# Patient Record
Sex: Male | Born: 1995
Health system: Southern US, Community
[De-identification: ages and names within clinical notes are randomized; demographics above are authoritative.]

## PROBLEM LIST (undated history)

## (undated) DIAGNOSIS — F32A Depression, unspecified: Secondary | ICD-10-CM

## (undated) DIAGNOSIS — R569 Unspecified convulsions: Secondary | ICD-10-CM

## (undated) HISTORY — DX: Depression, unspecified: F32.A

## (undated) HISTORY — DX: Unspecified convulsions: R56.9

---
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
## 2013-02-04 ENCOUNTER — Other Ambulatory Visit: Payer: Self-pay | Admitting: Family

## 2013-02-04 DIAGNOSIS — G40309 Generalized idiopathic epilepsy and epileptic syndromes, not intractable, without status epilepticus: Secondary | ICD-10-CM

## 2013-02-04 DIAGNOSIS — Z79899 Other long term (current) drug therapy: Secondary | ICD-10-CM

## 2013-02-05 LAB — CBC WITH DIFFERENTIAL/PLATELET
Eosinophils Absolute: 0.2 10*3/uL (ref 0.0–0.7)
Eosinophils Relative: 4 % (ref 0–5)
HCT: 44.2 % (ref 39.0–52.0)
Hemoglobin: 16.2 g/dL (ref 13.0–17.0)
Lymphocytes Relative: 31 % (ref 12–46)
Lymphs Abs: 1.8 10*3/uL (ref 0.7–4.0)
MCH: 30.5 pg (ref 26.0–34.0)
MCV: 83.2 fL (ref 78.0–100.0)
Monocytes Absolute: 0.6 10*3/uL (ref 0.1–1.0)
Monocytes Relative: 11 % (ref 3–12)
Platelets: 249 10*3/uL (ref 150–400)
RBC: 5.31 MIL/uL (ref 4.22–5.81)
WBC: 5.7 10*3/uL (ref 4.0–10.5)

## 2013-02-06 ENCOUNTER — Other Ambulatory Visit: Payer: Self-pay | Admitting: Family

## 2013-02-06 DIAGNOSIS — G40309 Generalized idiopathic epilepsy and epileptic syndromes, not intractable, without status epilepticus: Secondary | ICD-10-CM

## 2013-02-06 DIAGNOSIS — G253 Myoclonus: Secondary | ICD-10-CM

## 2013-02-09 LAB — VALPROIC ACID LEVEL: Valproic Acid Lvl: 55.6 ug/mL (ref 50.0–100.0)

## 2013-03-04 IMAGING — CT CT HEAD W/O CM
1 series · 16 of 30 positions shown, 20 images · non-contrast
Comparison: 07/04/2008.

CLINICAL DATA: New onset seizures.

CT HEAD WITHOUT CONTRAST
TECHNIQUE: Contiguous axial images were obtained from the base of
the skull through the vertex without contrast.

[Series 2: head routine 4.8 h37s · axial · 0.43mm/px · z∈[+1297,+1430]mm · 16 of 30 slices shown, 20 images]
[im 2/30  brain]
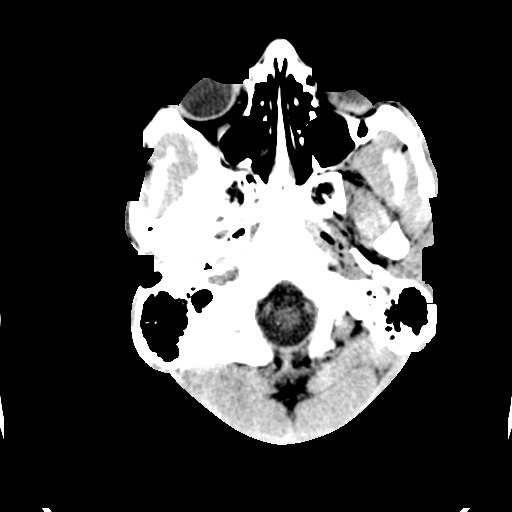
[im 2/30  bone]
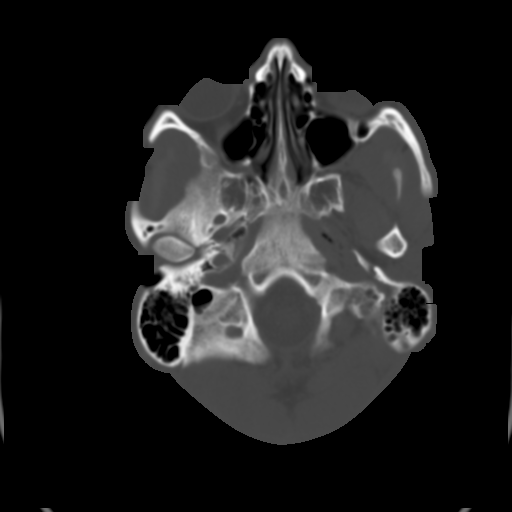
[im 4/30  brain]
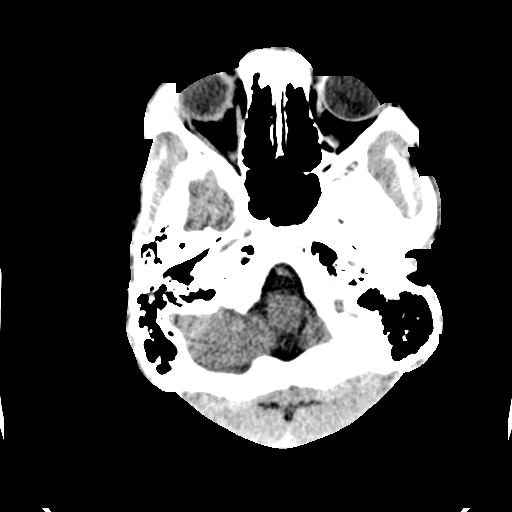
[im 6/30  brain]
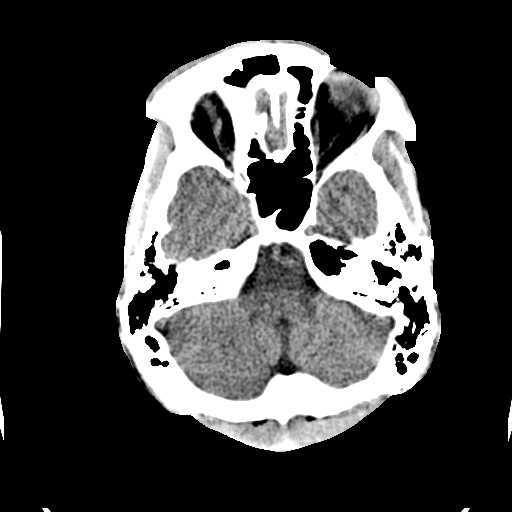
[im 8/30  brain]
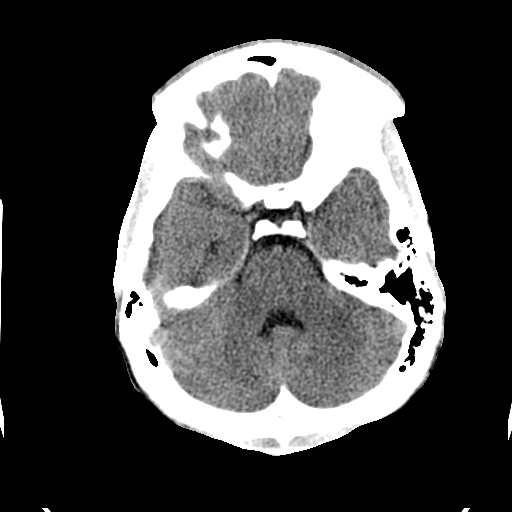
[im 9/30  brain]
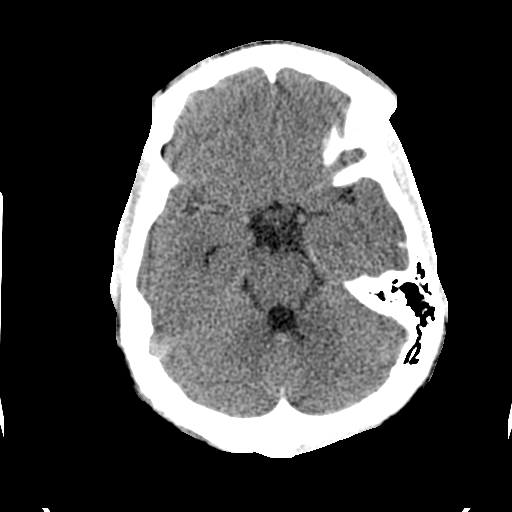
[im 9/30  bone]
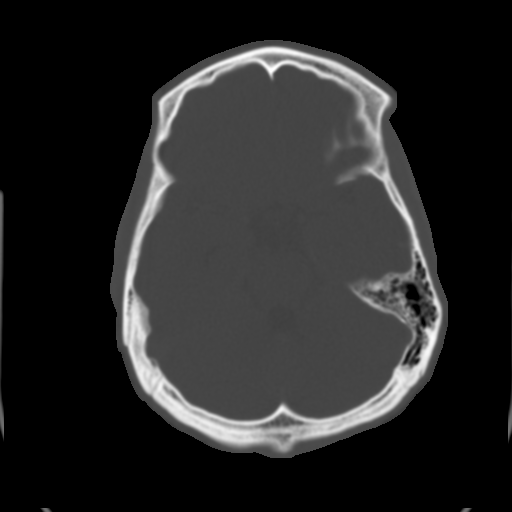
[im 11/30  brain]
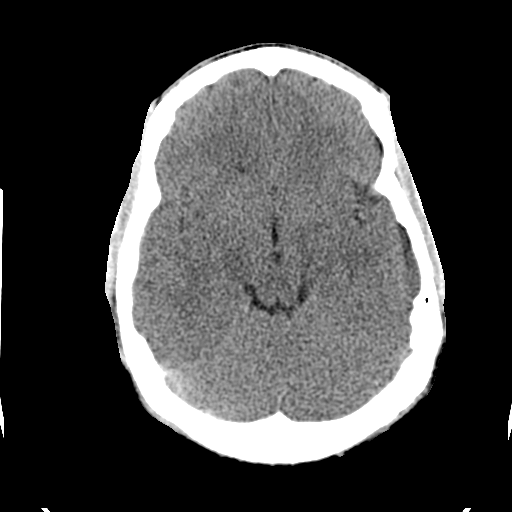
[im 13/30  brain]
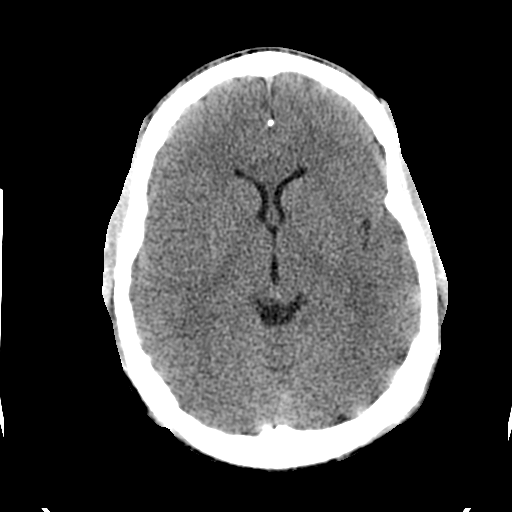
[im 15/30  brain]
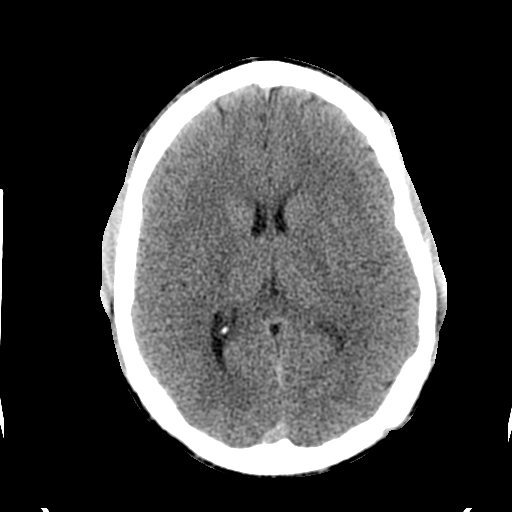
[im 16/30  brain]
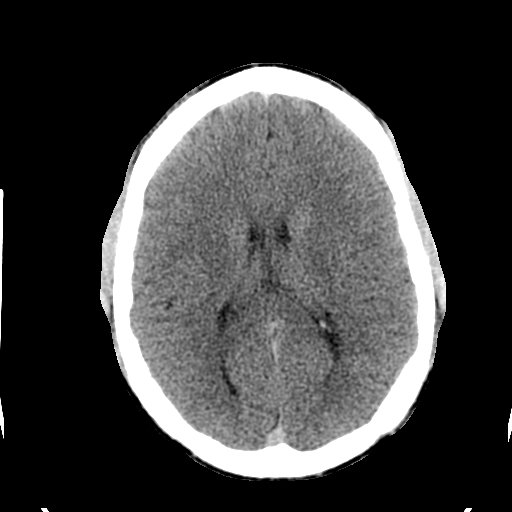
[im 16/30  bone]
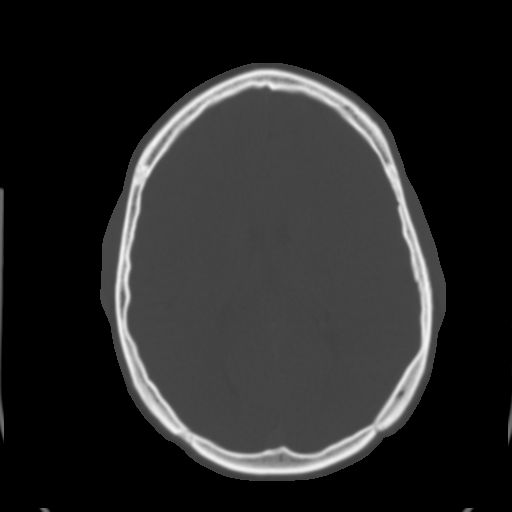
[im 18/30  brain]
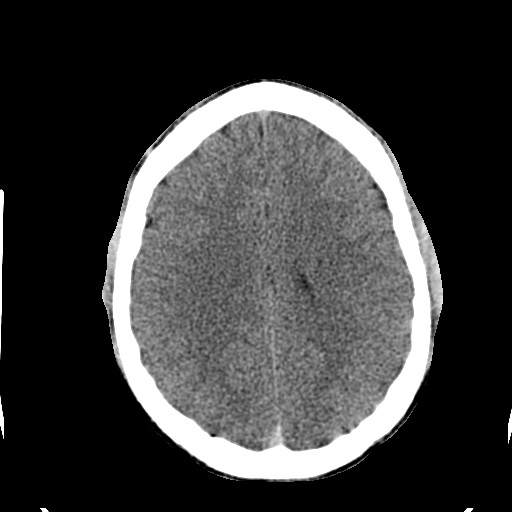
[im 20/30  brain]
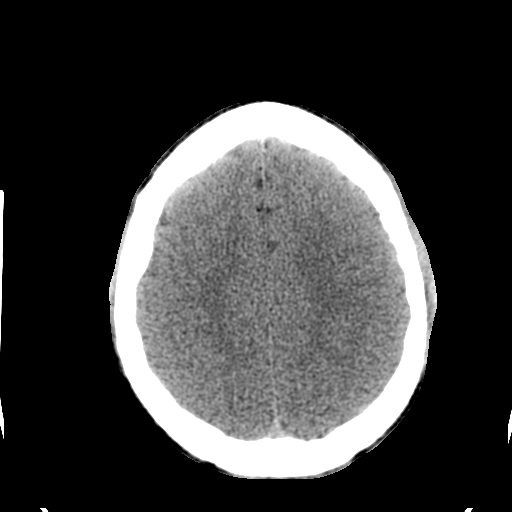
[im 22/30  brain]
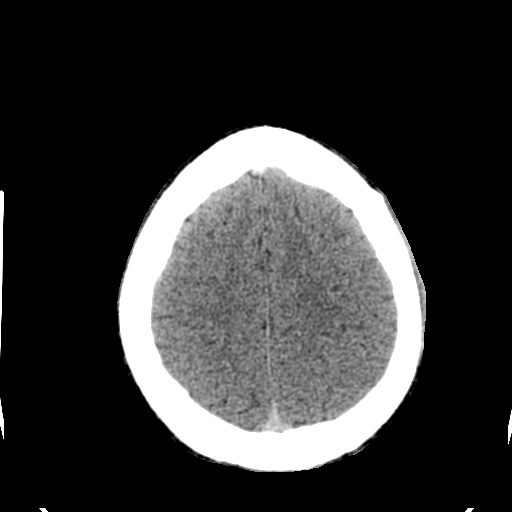
[im 23/30  brain]
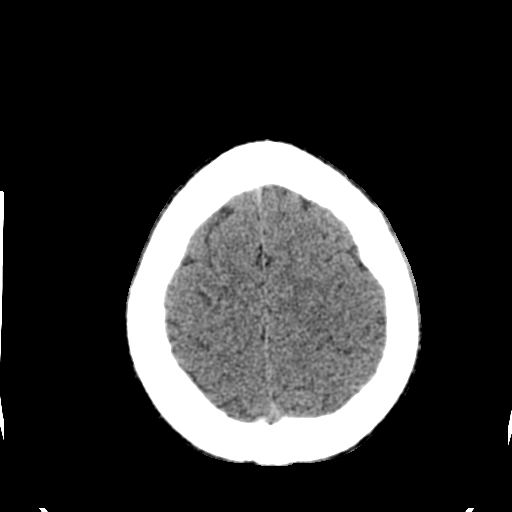
[im 23/30  bone]
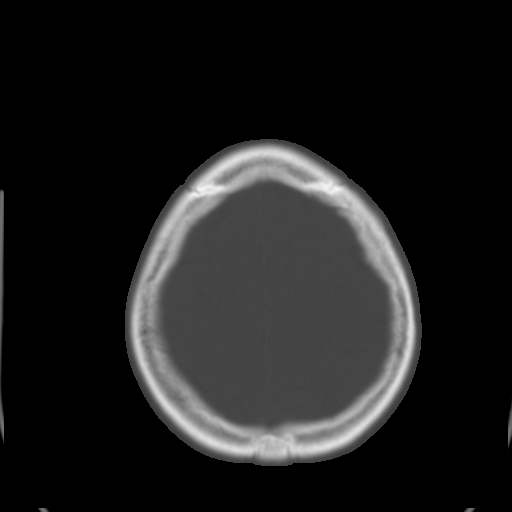
[im 25/30  brain]
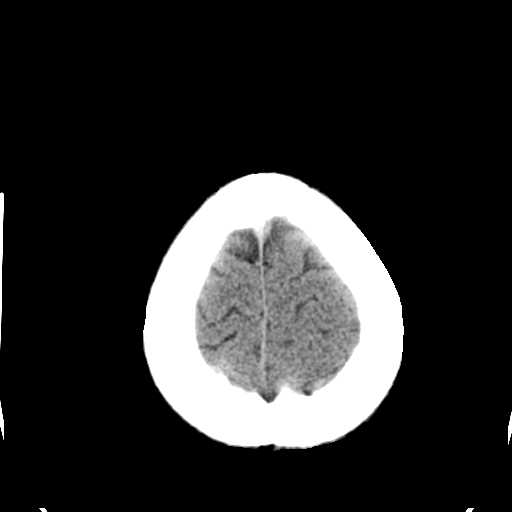
[im 27/30  brain]
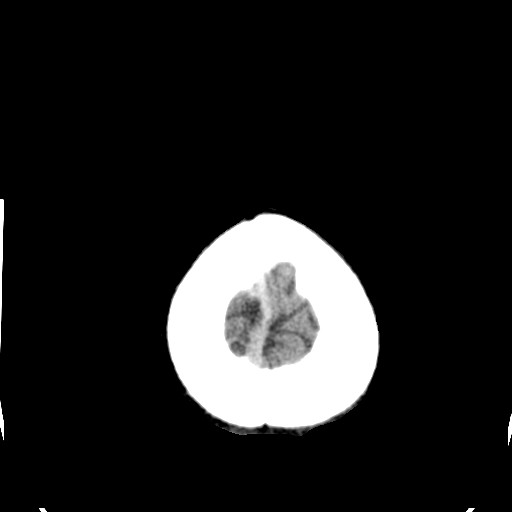
[im 29/30  brain]
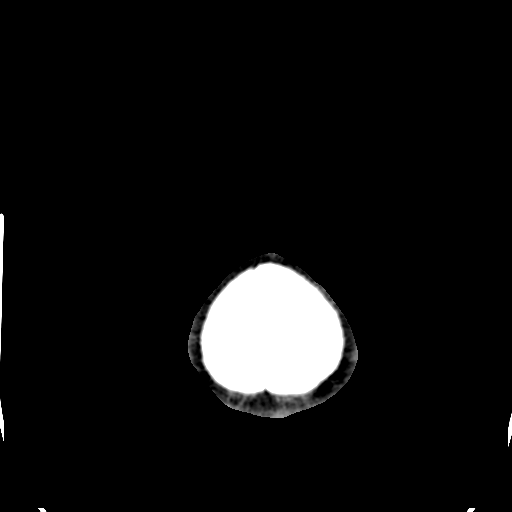

[16 of 30 positions shown; findings below may reference images not displayed]

FINDINGS: Previous study of 07/04/2008 states history of seizure.
Presumably this represents recurrent seizure in the setting of
chronic seizure disorder.

No mass lesion, mass effect, midline shift, hydrocephalus,
hemorrhage.  No territorial ischemia or acute infarction.  No
interval change from prior exam.
IMPRESSION: Negative CT head.

## 2013-07-03 ENCOUNTER — Ambulatory Visit: Payer: Self-pay | Admitting: Family

## 2013-12-07 ENCOUNTER — Encounter (HOSPITAL_BASED_OUTPATIENT_CLINIC_OR_DEPARTMENT_OTHER): Payer: Self-pay | Admitting: Emergency Medicine

## 2013-12-07 ENCOUNTER — Emergency Department (HOSPITAL_BASED_OUTPATIENT_CLINIC_OR_DEPARTMENT_OTHER)
Admission: EM | Admit: 2013-12-07 | Discharge: 2013-12-07 | Discharge status: Against Medical Advice | Payer: BC Managed Care – PPO | Attending: Emergency Medicine | Admitting: Emergency Medicine

## 2013-12-07 DIAGNOSIS — R404 Transient alteration of awareness: Secondary | ICD-10-CM | POA: Insufficient documentation

## 2013-12-07 DIAGNOSIS — Y9229 Other specified public building as the place of occurrence of the external cause: Secondary | ICD-10-CM | POA: Insufficient documentation

## 2013-12-07 DIAGNOSIS — S01501A Unspecified open wound of lip, initial encounter: Secondary | ICD-10-CM | POA: Insufficient documentation

## 2013-12-07 DIAGNOSIS — W1809XA Striking against other object with subsequent fall, initial encounter: Secondary | ICD-10-CM | POA: Insufficient documentation

## 2013-12-07 DIAGNOSIS — Y9389 Activity, other specified: Secondary | ICD-10-CM | POA: Insufficient documentation

## 2013-12-07 NOTE — ED Notes (Signed)
Registration staff reported patinet walked out of ER waiting room.

## 2013-12-07 NOTE — ED Notes (Signed)
Pt walking to classroom and felt faint. Remembers going to ground and waking up. Pt states hit mouth on cement. Pt has chipped tooth and has small lac on upper lip from fall. Pt states only drank apple juice this am for breakfast but has no history of passing out or low blood sugar.

## 2014-08-20 ENCOUNTER — Encounter: Payer: Self-pay | Admitting: *Deleted

## 2014-08-20 DIAGNOSIS — G40309 Generalized idiopathic epilepsy and epileptic syndromes, not intractable, without status epilepticus: Secondary | ICD-10-CM | POA: Insufficient documentation

## 2014-08-20 DIAGNOSIS — G253 Myoclonus: Secondary | ICD-10-CM

## 2014-08-23 ENCOUNTER — Ambulatory Visit (INDEPENDENT_AMBULATORY_CARE_PROVIDER_SITE_OTHER): Payer: BC Managed Care – PPO | Admitting: Family

## 2014-08-23 ENCOUNTER — Encounter: Payer: Self-pay | Admitting: Family

## 2014-08-23 VITALS — BP 114/78 | HR 84 | Ht 65.0 in | Wt 126.2 lb

## 2014-08-23 DIAGNOSIS — G253 Myoclonus: Secondary | ICD-10-CM

## 2014-08-23 DIAGNOSIS — G40309 Generalized idiopathic epilepsy and epileptic syndromes, not intractable, without status epilepticus: Secondary | ICD-10-CM

## 2014-08-23 NOTE — Progress Notes (Signed)
Patient: Raymond Hammond MRN: [id number] Sex: male DOB: Dec 14, 1995  Provider: Elveria RisingGOODPASTURE, Deago Burruss, NP Location of Care: [facility]  Note type: Routine return visit  History of Present Illness: Referral Source: Truddie Cocoamika Bush, DO History from: patient Chief Complaint: Seizures  Raymond SorrowJames Hammond is a 18 y.o. with history of generalized tonic-clonic seizures. He was last seen November 06, 2012. Raymond Hammond had an EEG on December 01, 2010 that was abnormal and showed episodes of 2-5 seconds of generalized frontally predominant irregularly contoured polyspike, spike and slow wave discharges. He also had an EEG on July 16, 2008 that was identical. 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 Raymond Hammond takes generic Depakote ER for his seizure disorder. He had transient leukopenia after starting this medication but has otherwise tolerated it well. He has had breakthrough seizures in the setting of noncompliance. Raymond Hammond had some problems with myoclonus initially, but has not had problems with that in recent years. Raymond Hammond tells me today that he has not had seizures since he was last seen, and the last documented seizure was July 2012.   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 He is doing very well academically, and plans to apply to the police academy. Raymond Hammond returns today because he needs a letter describing his seizure disorder for another provider doing a Criminal Justice physical.  Review of Systems: 12 system review was unremarkable  Past Medical History  Diagnosis Date  . Seizures    Hospitalizations: No., Head Injury: No., Nervous System Infections: No., Immunizations up to date: Yes.   Past Medical History Comments: see Hx.  Surgical History No past surgical history on file.  Family History family history includes Congestive Heart Failure in his maternal grandmother; Heart failure in his maternal grandfather; Seizures in  his other; Seizures (age of onset: 4715) in his mother. Family History is otherwise negative for migraines, seizures, cognitive impairment, blindness, deafness, birth defects, chromosomal disorder, autism.  Social History History   Social History  . Marital Status: Single    Spouse Name: N/A    Number of Children: N/A  . Years of Education: N/A   Social History Main Topics  . Smoking status: Never Smoker   . Smokeless tobacco: Never Used  . Alcohol Use: No  . Drug Use: No  . Sexual Activity: Yes   Other Topics Concern  . None   Social History Narrative   Educational level: university School Attending:[employer] Living with:  both parents  Hobbies/Interest: playing video games School comments:  Raymond Hammond is doing well. He is on the Dean's list.  Physical Exam BP 114/78 mmHg  Pulse 84  Ht 5\' 5"  (1.651 m)  Wt 126 lb 3.2 oz (57.244 kg)  BMI 21.00 kg/m2 General: well developed, well nourished young man, seated on exam table, in no evident distress Head: head normocephalic and atraumatic.  Oropharynx benign. Neck: supple with no carotid or supraclavicular bruits Cardiovascular: regular rate and rhythm, no murmurs Skin: No rashes or lesions  Neurologic Exam Mental Status: Awake and fully alert.  Oriented to place and time.  Recent and remote memory intact.  Attention span, concentration, and fund of knowledge appropriate.  Mood and affect appropriate. Cranial Nerves: Fundoscopic exam reveals sharp disc margins.  Pupils equal, briskly reactive to light.  Extraocular movements full without nystagmus.  Visual fields full to confrontation.  Hearing intact and symmetric to finger rub.  Facial sensation intact.  Face tongue, palate move normally and symmetrically.  Neck flexion and extension normal. Motor: Normal bulk and tone. Normal strength in all tested extremity muscles. Sensory: Intact to touch and temperature in all extremities.  Coordination: Rapid  alternating movements normal in all extremities.  Finger-to-nose and heel-to shin performed accurately bilaterally.  Romberg negative. Gait and Station: Arises from chair without difficulty.  Stance is normal. Gait demonstrates normal stride length and balance.   Able to heel, toe and tandem walk without difficulty. Reflexes: Diminished and symmetric. Toes downgoing.  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 He needs a letter for another provider describing his seizure disorder, which I will be happy to provide. I talked with Raymond Hammond about his seizure disorder, and reminded him that he is at risk for breakthrough seizures in the setting of sleep deprivation and missed medication. He asked about flashing lights or strobe lights triggering seizures, and I explained to him that his EEG has not shown that he has a photosensitive epilepsy, nor has he had seizures triggered by flashing lights. I cautioned him about the need for him to be compliant with his medication and for him to get adequate sleep. I will see him back in follow up in 1 year or sooner if needed.

## 2014-08-24 ENCOUNTER — Encounter: Payer: Self-pay | Admitting: Family

## 2014-08-24 NOTE — Patient Instructions (Signed)
Continue the generic Depakote ER 500mg  - 2 tablets at bedtime. Remember that missed doses of medication and sleep deprivation are seizure triggers for you.   I will send a letter to Judie BonusAndy Secrest, PA-C as requested regarding your seizure disorder.   Please plan to return for follow up in 1 year or sooner if needed.

## 2014-09-07 ENCOUNTER — Telehealth: Payer: Self-pay | Admitting: *Deleted

## 2014-09-07 NOTE — Telephone Encounter (Signed)
The pt would like to know about the status of the police physical paperwork. He can be reached at [phone]

## 2014-09-07 NOTE — Telephone Encounter (Signed)
Please let Fayrene FearingJames know that I mailed the requested letter to Judie BonusAndy Secrest, PA at [facility] on December 23rd. If Mr Lorne SkeensSecrest has not received it, we can fax a copy of the letter. Thanks, Inetta Fermoina

## 2014-09-08 NOTE — Telephone Encounter (Signed)
The pt said the paperwork was received.

## 2014-09-08 NOTE — Telephone Encounter (Signed)
Noted. TG 

## 2015-08-24 ENCOUNTER — Ambulatory Visit (INDEPENDENT_AMBULATORY_CARE_PROVIDER_SITE_OTHER): Payer: BLUE CROSS/BLUE SHIELD | Admitting: Family

## 2015-08-24 ENCOUNTER — Encounter: Payer: Self-pay | Admitting: Family

## 2015-08-24 VITALS — BP 110/70 | HR 80 | Ht 65.0 in | Wt 119.6 lb

## 2015-08-24 DIAGNOSIS — G253 Myoclonus: Secondary | ICD-10-CM | POA: Diagnosis not present

## 2015-08-24 DIAGNOSIS — G40309 Generalized idiopathic epilepsy and epileptic syndromes, not intractable, without status epilepticus: Secondary | ICD-10-CM | POA: Diagnosis not present

## 2015-08-24 NOTE — Progress Notes (Signed)
Patient: Raymond Hammond MRN: [id number] Sex: male DOB: Jun 13, 1996  Provider: Elveria Risingina Irene Collings, NP [facility]  Note type: Routine return visit  History of Present Illness: Referral Source: Truddie Cocoamika Bush, DO History from: patient and [facility] chart Chief Complaint: Seizures  Raymond Hammond is a 19 y.o. young man with history of generalized tonic-clonic seizures. He was last seen August 23, 2014. Raymond Hammond had an EEG on December 01, 2010 that was abnormal and showed episodes of 2-5 seconds of generalized frontally predominant irregularly contoured polyspike, spike and slow wave discharges. He also had an EEG on July 16, 2008 that was identical. 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 Raymond Hammond takes generic Depakote ER for his seizure disorder. He had transient leukopenia after starting this medication but has otherwise tolerated it well. He has had breakthrough seizures in the setting of noncompliance in the past. Raymond Hammond had some problems with myoclonus initially, but has not had problems with that in recent years. Raymond Hammond tells me today that he has not had seizures since he was last seen, and the last documented seizure was July 2012.   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 He is interested in a career with the [employer] after graduation. Raymond Hammond brought a DMV form in for me to complete today.   Raymond Hammond has been otherwise healthy and has no other health concerns for him today other than previously mentioned.  Review of Systems: Please see the HPI for neurologic and other pertinent review of systems. Otherwise, the following systems are noncontributory including constitutional, eyes, ears, nose and throat, cardiovascular, respiratory, gastrointestinal, genitourinary, musculoskeletal, skin, endocrine, hematologic/lymph, allergic/immunologic and  psychiatric.   Past Medical History  Diagnosis Date  . Seizures (HCC)    Hospitalizations: No., Head Injury: No., Nervous System Infections: No., Immunizations up to date: Yes.   Past Medical History Comments: See history  Surgical History No past surgical history on file.  Family History family history includes Congestive Heart Failure in his maternal grandmother; Heart failure in his maternal grandfather; Seizures in his other; Seizures (age of onset: 4515) in his mother. Family History is otherwise negative for migraines, seizures, cognitive impairment, blindness, deafness, birth defects, chromosomal disorder, autism.  Social History Social History   Social History  . Marital Status: Single    Spouse Name: N/A  . Number of Children: N/A  . Years of Education: N/A   Occupational History  .  [employer]   Social History Main Topics  . Smoking status: Never Smoker   . Smokeless tobacco: Never Used  . Alcohol Use: No  . Drug Use: No  . Sexual Activity: Yes   Other Topics Concern  . 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 He plans on becoming a campus policeman after graduation and is currently employed at [employer]. He lives with his parents. He enjoys video games, working, listening to music, wrestling, and hanging out with his girlfriend.     Allergies Allergies  Allergen Reactions  . Keppra [Levetiracetam]     Physical Exam BP 110/70 mmHg  Pulse 80  Ht 5\' 5"  (1.651 m)  Wt 119 lb 9.6 oz (54.25 kg)  BMI 19.90 kg/m2 General: well developed, well nourished young man, seated on exam  table, in no evident distress Head: head normocephalic and atraumatic. Oropharynx benign. Neck: supple with no carotid or supraclavicular bruits Cardiovascular: regular rate and rhythm, no murmurs Skin: No rashes or lesions  Neurologic Exam Mental Status: Awake and fully alert. Oriented to place and  time. Recent and remote memory intact. Attention span, concentration, and fund of knowledge appropriate. Mood and affect appropriate. Cranial Nerves: Fundoscopic exam reveals sharp disc margins. Pupils equal, briskly reactive to light. Extraocular movements full without nystagmus. Visual fields full to confrontation. Hearing intact and symmetric to finger rub. Facial sensation intact. Face tongue, palate move normally and symmetrically. Neck flexion and extension normal. Motor: Normal bulk and tone. Normal strength in all tested extremity muscles. Sensory: Intact to touch and temperature in all extremities.  Coordination: Rapid alternating movements normal in all extremities. Finger-to-nose and heel-to shin performed accurately bilaterally. Romberg negative. Gait and Station: Arises from chair without difficulty. Stance is normal. Gait demonstrates normal stride length and balance. Able to heel, toe and tandem walk without difficulty. Reflexes: Diminished and symmetric. Toes downgoing.  Impression 1. Generalized convulsive epilepsy 2. Myoclonus  Recommendations for plan of care The patient's previous [facility] records were reviewed. Jaaron has neither had nor required imaging or lab studies since the last visit. He is a 19 year old young man with generalized convulsive epilepsy. 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 Kaynen has been seizure free since July 2012 but because he needs to continue to drive, will continue taking Divalproex for his seizure disorder. I cautioned him about the need for him to be compliant with his medication and for him to get adequate sleep. Ash brought a DMV form in for completion which I will do. For follow up care, I will refer him to Dr Patrcia Dolly for follow up in December 2017 as a transition to adult neurology care. John agrees with these plans.   The medication list was reviewed and reconciled.  No changes  were made in the prescribed medications today.  A complete medication list was provided to the patient.  Total time spent with the patient was 20 minutes, of which 50% or more was spent in counseling and coordination of care.

## 2015-08-25 MED ORDER — DIVALPROEX SODIUM ER 500 MG PO TB24: ORAL_TABLET | ORAL | Status: DC

## 2015-08-25 NOTE — Patient Instructions (Signed)
Continue taking Depakote (Divalproex) ER as you have been taking it. Remember that it is important to take your medication every day, and that you need to avoid sleep deprivation.   If you have any breakthrough seizures, let me know.   I will refer you to Dr Patrcia DollyKaren Aquino for your next follow up in December 2017. She is a neurologist that specializes in adults with epilepsy. Please call me for any questions or concerns until you see Dr Karel JarvisAquino.

## 2015-09-07 ENCOUNTER — Encounter: Payer: Self-pay | Admitting: Family

## 2015-09-07 ENCOUNTER — Telehealth: Payer: Self-pay | Admitting: *Deleted

## 2015-09-07 NOTE — Telephone Encounter (Signed)
Raymond Hammond called and states that he received a letter from the [facility] stating they have not received paperwork from our office clearing Raymond Hammond for driving.   Please call back [phone]

## 2015-09-07 NOTE — Telephone Encounter (Signed)
Please let Raymond Hammond know that I tried to reach him by phone during Christmas holidays but was unable to do so. The form was completed, however I cannot send it to [facility] until he completes page 1, authorizing me to send the document to the [facility]. I mailed his form to him today with a letter explaining that. The other option is for him to stop by and complete my copy of page 1 and then I will fax and mail the document to the [facility]. Let me know if you have questions. Thanks, Inetta Fermoina

## 2015-09-07 NOTE — Telephone Encounter (Signed)
Called and left Fayrene FearingJames a voicemail asking him to return our call.

## 2016-06-18 ENCOUNTER — Other Ambulatory Visit (INDEPENDENT_AMBULATORY_CARE_PROVIDER_SITE_OTHER): Payer: Self-pay | Admitting: Family

## 2016-06-18 DIAGNOSIS — G40309 Generalized idiopathic epilepsy and epileptic syndromes, not intractable, without status epilepticus: Secondary | ICD-10-CM

## 2016-06-18 DIAGNOSIS — G253 Myoclonus: Secondary | ICD-10-CM

## 2017-04-18 ENCOUNTER — Encounter (INDEPENDENT_AMBULATORY_CARE_PROVIDER_SITE_OTHER): Payer: Self-pay | Admitting: Family

## 2019-12-18 ENCOUNTER — Ambulatory Visit: Payer: Self-pay | Attending: Internal Medicine

## 2019-12-18 DIAGNOSIS — Z23 Encounter for immunization: Secondary | ICD-10-CM

## 2019-12-18 NOTE — Progress Notes (Signed)
   [id number]  Name:  Raymond Hammond.    MRN: [id number] DOB: 1996-06-11  12/18/2019  Mr. [name] was observed post Covid-19 immunization for 15 minutes without incident. He was provided with Vaccine Information Sheet and instruction to access the V-Safe system.   Mr. Ace was instructed to call 911 with any severe reactions post vaccine: Marland Kitchen Difficulty breathing  . Swelling of face and throat  . A fast heartbeat  . A bad rash all over body  . Dizziness and weakness   Immunizations Administered    Name Date Dose VIS Date Route   Pfizer COVID-19 Vaccine 12/18/2019 10:57 AM 0.3 mL 08/14/2019 Intramuscular   Manufacturer: [employer], [employer]   Lot: [id number]   NDC: [address]-6

## 2020-01-18 ENCOUNTER — Ambulatory Visit: Payer: Self-pay | Attending: Internal Medicine

## 2020-01-18 DIAGNOSIS — Z23 Encounter for immunization: Secondary | ICD-10-CM

## 2020-01-18 NOTE — Progress Notes (Signed)
   [id number]  Name:  Raymond Hammond.    MRN: [id number] DOB: 1995-09-20  01/18/2020  Raymond Hammond was observed post Covid-19 immunization for 15 minutes without incident. He was provided with Vaccine Information Sheet and instruction to access the V-Safe system.   Raymond Hammond was instructed to call 911 with any severe reactions post vaccine: Marland Kitchen Difficulty breathing  . Swelling of face and throat  . A fast heartbeat  . A bad rash all over body  . Dizziness and weakness   Immunizations Administered    Name Date Dose VIS Date Route   Pfizer COVID-19 Vaccine 01/18/2020 12:15 PM 0.3 mL 10/28/2018 Intramuscular   Manufacturer: [employer], [employer]   Lot: [id number]   NDC: [address]-1

## 2020-08-30 ENCOUNTER — Ambulatory Visit: Payer: BC Managed Care – PPO | Attending: Internal Medicine

## 2020-08-30 ENCOUNTER — Other Ambulatory Visit (HOSPITAL_BASED_OUTPATIENT_CLINIC_OR_DEPARTMENT_OTHER): Payer: Self-pay | Admitting: Internal Medicine

## 2020-08-30 DIAGNOSIS — Z23 Encounter for immunization: Secondary | ICD-10-CM

## 2020-08-30 MED FILL — PFIZER-BIONTECH COVID-19 VA: 30 | 21 days supply | Qty: 0 | Fill #0

## 2020-08-30 NOTE — Progress Notes (Signed)
   [id number]  Name:  Raymond Hammond.    MRN: [id number] DOB: [birth year]  08/30/2020  [name] was observed post Covid-19 immunization for 15 minutes without incident. He was provided with Vaccine Information Sheet and instruction to access the V-Safe system.  Vaccinated by Fredirick Maudlin  [name] was instructed to call 911 with any severe reactions post vaccine: Marland Kitchen Difficulty breathing  . Swelling of face and throat  . A fast heartbeat  . A bad rash all over body  . Dizziness and weakness   Immunizations Administered    Name Date Dose VIS Date Route   Pfizer COVID-19 Vaccine 08/30/2020 10:52 AM 0.3 mL 06/22/2020 Intramuscular   Manufacturer: [employer], [employer]   Lot: [id number]   NDC: [address]-6

## 2021-01-26 ENCOUNTER — Ambulatory Visit: Payer: BC Managed Care – PPO

## 2023-09-24 ENCOUNTER — Emergency Department (HOSPITAL_BASED_OUTPATIENT_CLINIC_OR_DEPARTMENT_OTHER)
Admission: EM | Admit: 2023-09-24 | Discharge: 2023-09-24 | Disposition: A | Discharge status: Home / Self Care | Payer: Worker's Compensation | Attending: Emergency Medicine | Admitting: Emergency Medicine

## 2023-09-24 ENCOUNTER — Other Ambulatory Visit: Payer: Self-pay

## 2023-09-24 ENCOUNTER — Encounter (HOSPITAL_BASED_OUTPATIENT_CLINIC_OR_DEPARTMENT_OTHER): Payer: Self-pay | Admitting: Emergency Medicine

## 2023-09-24 ENCOUNTER — Emergency Department (HOSPITAL_BASED_OUTPATIENT_CLINIC_OR_DEPARTMENT_OTHER): Payer: Worker's Compensation

## 2023-09-24 DIAGNOSIS — M25552 Pain in left hip: Secondary | ICD-10-CM

## 2023-09-24 DIAGNOSIS — Y92410 Unspecified street and highway as the place of occurrence of the external cause: Secondary | ICD-10-CM | POA: Insufficient documentation

## 2023-09-24 MED ORDER — IBUPROFEN 600 MG PO TABS: 600.0000 mg | ORAL_TABLET | Freq: Four times a day (QID) | ORAL | 0 refills | Status: DC | PRN

## 2023-09-24 MED ORDER — IBUPROFEN 800 MG PO TABS
800.0000 mg | ORAL_TABLET | Freq: Once | ORAL | Status: AC
  Administered 2023-09-24: 800 mg via ORAL
  Filled 2023-09-24: qty 1

## 2023-09-24 MED ORDER — CYCLOBENZAPRINE HCL 10 MG PO TABS: 10.0000 mg | ORAL_TABLET | Freq: Two times a day (BID) | ORAL | 0 refills | Status: DC | PRN
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
## 2023-10-03 ENCOUNTER — Ambulatory Visit: Payer: BLUE CROSS/BLUE SHIELD | Admitting: Family Medicine

## 2023-10-04 ENCOUNTER — Ambulatory Visit (INDEPENDENT_AMBULATORY_CARE_PROVIDER_SITE_OTHER): Payer: Worker's Compensation | Admitting: Family Medicine

## 2023-10-04 ENCOUNTER — Encounter: Payer: Self-pay | Admitting: Family Medicine

## 2023-10-04 VITALS — BP 151/77 | HR 64 | Temp 98.8°F | Resp 18 | Ht 65.0 in | Wt 148.5 lb

## 2023-10-04 DIAGNOSIS — F32A Depression, unspecified: Secondary | ICD-10-CM | POA: Insufficient documentation

## 2023-10-04 DIAGNOSIS — Z23 Encounter for immunization: Secondary | ICD-10-CM | POA: Insufficient documentation

## 2023-10-04 DIAGNOSIS — Z7689 Persons encountering health services in other specified circumstances: Secondary | ICD-10-CM | POA: Insufficient documentation

## 2023-10-04 DIAGNOSIS — Z13220 Encounter for screening for lipoid disorders: Secondary | ICD-10-CM | POA: Insufficient documentation

## 2023-10-04 DIAGNOSIS — Z0289 Encounter for other administrative examinations: Secondary | ICD-10-CM | POA: Insufficient documentation

## 2023-10-04 NOTE — Assessment & Plan Note (Signed)
Symptoms well controlled on Prozac 20 mg daily. PHQ-9: 0  Provider will take over prescribing this  medication in the future.
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
## 2023-10-08 ENCOUNTER — Encounter: Payer: Self-pay | Admitting: Family Medicine

## 2023-10-08 ENCOUNTER — Ambulatory Visit (INDEPENDENT_AMBULATORY_CARE_PROVIDER_SITE_OTHER): Payer: Self-pay | Admitting: Family Medicine

## 2023-10-08 VITALS — BP 122/88 | HR 94 | Temp 98.7°F | Resp 18 | Ht 65.0 in | Wt 148.3 lb

## 2023-10-08 DIAGNOSIS — R030 Elevated blood-pressure reading, without diagnosis of hypertension: Secondary | ICD-10-CM | POA: Insufficient documentation

## 2023-10-08 DIAGNOSIS — Z0000 Encounter for general adult medical examination without abnormal findings: Secondary | ICD-10-CM

## 2023-10-08 DIAGNOSIS — Z136 Encounter for screening for cardiovascular disorders: Secondary | ICD-10-CM

## 2023-10-08 DIAGNOSIS — Z13220 Encounter for screening for lipoid disorders: Secondary | ICD-10-CM | POA: Diagnosis not present

## 2023-10-08 DIAGNOSIS — Z1159 Encounter for screening for other viral diseases: Secondary | ICD-10-CM | POA: Diagnosis not present

## 2023-10-08 NOTE — Progress Notes (Signed)
 Complete physical exam  Patient: Raymond Hammond.   DOB: 01/04/1996   28 y.o. Male  MRN: [id number]  Subjective:    Chief Complaint  Patient presents with   Annual Exam    Patient is here for his annual physical. Patient is fasting this morning    Raymond Hammond. is a 28 y.o. male who presents today for a complete physical exam. He reports consuming a general and more salads recently with lean proteins  diet. Gym/ health club routine includes running and boxing. He generally feels well. He reports sleeping well. He does not have additional problems to discuss today.    Most recent fall risk assessment:    10/04/2023    9:15 AM  Fall Risk   Falls in the past year? 0  Number falls in past yr: 0  Injury with Fall? 0  Risk for fall due to : No Fall Risks  Follow up Falls evaluation completed     Most recent depression screenings:    10/04/2023    9:15 AM  PHQ 2/9 Scores  PHQ - 2 Score 0  PHQ- 9 Score 0    Vision:Within last year and Dental: No current dental problems and Receives regular dental care    Patient Care Team: Booker Darice SAUNDERS, FNP as PCP - General (Family Medicine)   Outpatient Medications Prior to Visit  Medication Sig   cyclobenzaprine  (FLEXERIL ) 10 MG tablet Take 1 tablet (10 mg total) by mouth 2 (two) times daily as needed for up to 14 doses for muscle spasms.   divalproex  (DEPAKOTE  ER) 500 MG 24 hr tablet Take 2 tablets at bedtime   FLUoxetine (PROZAC) 20 MG capsule Take 20 mg by mouth daily.   No facility-administered medications prior to visit.    Review of Systems  Constitutional:  Negative for malaise/fatigue.  Endo/Heme/Allergies:  Negative for polydipsia.          Objective:     BP 122/88 (BP Location: Right Arm, Patient Position: Sitting, Cuff Size: Normal)   Pulse 94   Temp 98.7 F (37.1 C) (Oral)   Resp 18   Ht 5' 5 (1.651 m)   Wt 148 lb 4.8 oz (67.3 kg)   SpO2 98%   BMI 24.68 kg/m    Physical Exam Vitals and nursing  note reviewed.  Constitutional:      General: He is not in acute distress.    Appearance: Normal appearance.  HENT:     Right Ear: Tympanic membrane normal.     Left Ear: Tympanic membrane normal.     Nose: Nose normal.     Mouth/Throat:     Mouth: Mucous membranes are moist.     Pharynx: Oropharynx is clear.  Eyes:     Extraocular Movements: Extraocular movements intact.  Neck:     Thyroid: No thyroid tenderness.  Cardiovascular:     Rate and Rhythm: Normal rate and regular rhythm.     Pulses:          Radial pulses are 2+ on the right side and 2+ on the left side.     Heart sounds: Normal heart sounds, S1 normal and S2 normal.  Pulmonary:     Effort: Pulmonary effort is normal.     Breath sounds: Normal breath sounds.  Abdominal:     General: Bowel sounds are normal.     Palpations: Abdomen is soft.     Tenderness: There is no abdominal tenderness.  Musculoskeletal:  General: Normal range of motion.     Cervical back: Normal range of motion.     Right lower leg: No edema.     Left lower leg: No edema.  Lymphadenopathy:     Cervical:     Right cervical: No superficial cervical adenopathy.    Left cervical: No superficial cervical adenopathy.  Skin:    General: Skin is warm and dry.  Neurological:     General: No focal deficit present.     Mental Status: He is alert. Mental status is at baseline.  Psychiatric:        Mood and Affect: Mood normal.        Behavior: Behavior normal.        Thought Content: Thought content normal.        Judgment: Judgment normal.      No results found for any visits on 10/08/23.     Assessment & Plan:    Routine Health Maintenance and Physical Exam  Immunization History  Administered Date(s) Administered   PFIZER(Purple Top)SARS-COV-2 Vaccination 12/18/2019, 01/18/2020, 08/30/2020   PPD Test 08/19/2014   Tdap 09/04/2011, 12/16/2019    Health Maintenance  Topic Date Due   HIV Screening  Never done   Hepatitis C  Screening  Never done   INFLUENZA VACCINE  Never done   COVID-19 Vaccine (4 - 2024-25 season) 05/05/2023   DTaP/Tdap/Td (3 - Td or Tdap) 12/15/2029   HPV VACCINES  Aged Out    Discussed health benefits of physical activity, and encouraged him to engage in regular exercise appropriate for his age and condition.  Annual physical exam -     CBC with Differential/Platelet -     Comprehensive metabolic panel  Encounter for lipid screening for cardiovascular disease -     Lipid panel  Need for hepatitis C screening test -     Hepatitis C antibody  Screening for viral disease -     HIV Antibody (routine testing w rflx)  Elevated blood pressure reading in office without diagnosis of hypertension Assessment & Plan: Blood pressure not at goal. Discussed dietary guidelines to control hypertension. He will monitor his blood pressure at home, watch sodium and processed food intake, return in 2 weeks. Continue exercise. DASH diet information provided. Bring blood pressure log at follow-up visit.        Routine labs ordered.  HCM reviewed/discussed. Hep C and HIV ordered today.  Anticipatory guidance regarding healthy weight, lifestyle and choices given. Recommend healthy diet.  Recommend approximately 150 minutes/week of moderate intensity exercise. Resistance training is good for building muscles and for bone health. Muscle mass helps to increase our metabolism and to burn more calories at rest.  Limit alcohol consumption: no more than one drink per day for women and 2 drinks per day for me. Recommend regular dental and vision exams. Always use seatbelt/lap and shoulder restraints. Recommend using smoke alarms and checking batteries at least twice a year. Recommend using sunscreen when outside.  Please know that I am here to help you with all of your health care goals and am happy to work with you to find a solution that works best for you.  The greatest advice I have received with any  changes in life are to take it one step at a time, that even means if all you can focus on is the next 60 seconds, then do that and celebrate your victories.  With any changes in life, you will have set backs, and  that is OK. The important thing to remember is, if you have a set back, it is not a failure, it is an opportunity to try again! Agrees with plan of care discussed.  Questions answered.      Return in about 2 weeks (around 10/22/2023) for elevated blood pressure.     Darice JONELLE Brownie, FNP

## 2023-10-08 NOTE — Assessment & Plan Note (Signed)
 Blood pressure not at goal. Discussed dietary guidelines to control hypertension. He will monitor his blood pressure at home, watch sodium and processed food intake, return in 2 weeks. Continue exercise. DASH diet information provided. Bring blood pressure log at follow-up visit.

## 2023-10-09 ENCOUNTER — Encounter: Payer: Self-pay | Admitting: Family Medicine

## 2023-10-09 LAB — COMPREHENSIVE METABOLIC PANEL
ALT: 15 [IU]/L (ref 0–44)
AST: 19 [IU]/L (ref 0–40)
Albumin: 5 g/dL (ref 4.3–5.2)
Alkaline Phosphatase: 102 [IU]/L (ref 44–121)
BUN/Creatinine Ratio: 9 (ref 9–20)
BUN: 10 mg/dL (ref 6–20)
Bilirubin Total: 0.8 mg/dL (ref 0.0–1.2)
CO2: 20 mmol/L (ref 20–29)
Calcium: 9.9 mg/dL (ref 8.7–10.2)
Chloride: 102 mmol/L (ref 96–106)
Creatinine, Ser: 1.16 mg/dL (ref 0.76–1.27)
Globulin, Total: 2.8 g/dL (ref 1.5–4.5)
Glucose: 85 mg/dL (ref 70–99)
Potassium: 4 mmol/L (ref 3.5–5.2)
Sodium: 142 mmol/L (ref 134–144)
Total Protein: 7.8 g/dL (ref 6.0–8.5)
eGFR: 87 mL/min/{1.73_m2} (ref >59)

## 2023-10-09 LAB — CBC WITH DIFFERENTIAL/PLATELET
Basophils Absolute: 0 10*3/uL (ref 0.0–0.2)
Basos: 1 %
EOS (ABSOLUTE): 0 10*3/uL (ref 0.0–0.4)
Eos: 1 %
Hematocrit: 52 % — ABNORMAL HIGH (ref 37.5–51.0)
Hemoglobin: 17.4 g/dL (ref 13.0–17.7)
Immature Grans (Abs): 0 10*3/uL (ref 0.0–0.1)
Immature Granulocytes: 0 %
Lymphocytes Absolute: 1.3 10*3/uL (ref 0.7–3.1)
Lymphs: 36 %
MCH: 30.1 pg (ref 26.6–33.0)
MCHC: 33.5 g/dL (ref 31.5–35.7)
MCV: 90 fL (ref 79–97)
Monocytes Absolute: 0.2 10*3/uL (ref 0.1–0.9)
Monocytes: 7 %
Neutrophils Absolute: 1.9 10*3/uL (ref 1.4–7.0)
Neutrophils: 55 %
Platelets: 254 10*3/uL (ref 150–450)
RBC: 5.79 x10E6/uL (ref 4.14–5.80)
RDW: 13.4 % (ref 11.6–15.4)
WBC: 3.5 10*3/uL (ref 3.4–10.8)

## 2023-10-09 LAB — HIV ANTIBODY (ROUTINE TESTING W REFLEX): HIV Screen 4th Generation wRfx: NONREACTIVE

## 2023-10-09 LAB — LIPID PANEL
Chol/HDL Ratio: 3.4 {ratio} (ref 0.0–5.0)
Cholesterol, Total: 181 mg/dL (ref 100–199)
HDL: 54 mg/dL (ref >39)
LDL Chol Calc (NIH): 115 mg/dL — ABNORMAL HIGH (ref 0–99)
Triglycerides: 63 mg/dL (ref 0–149)
VLDL Cholesterol Cal: 12 mg/dL (ref 5–40)

## 2023-10-09 LAB — HEPATITIS C ANTIBODY: Hep C Virus Ab: NONREACTIVE

## 2023-10-22 ENCOUNTER — Encounter: Payer: Self-pay | Admitting: Family Medicine

## 2023-10-22 ENCOUNTER — Ambulatory Visit (INDEPENDENT_AMBULATORY_CARE_PROVIDER_SITE_OTHER): Payer: 59 | Admitting: Family Medicine

## 2023-10-22 VITALS — BP 102/66 | HR 83 | Temp 98.8°F | Ht 65.0 in | Wt 148.4 lb

## 2023-10-22 DIAGNOSIS — Z23 Encounter for immunization: Secondary | ICD-10-CM

## 2023-10-22 DIAGNOSIS — R030 Elevated blood-pressure reading, without diagnosis of hypertension: Secondary | ICD-10-CM

## 2023-10-22 NOTE — Progress Notes (Signed)
   Established Patient Office Visit  Subjective   Patient ID: Raymond Wenger., male    DOB: 1996/05/19  Age: 28 y.o. MRN: [id number]  Chief Complaint  Patient presents with   Hypertension    BP f/u     HPI Hypertension Medication compliance: not currently on medications Denies chest pain, shortness of breath, lower extremity edema, vision changes, headaches.  Pertinent lab work: normal CMP  Monitoring at home: 120s/70-80s Tolerating medication well: n/a Continue current medication regimen: n/a Follow-up: n/a  Well controlled.  No further follow-up  Need for vaccine: Covid vaccine today.    Review of Systems  Eyes:  Negative for blurred vision and double vision.  Respiratory:  Negative for shortness of breath.   Cardiovascular:  Negative for chest pain.  Neurological:  Negative for headaches.     Objective:     BP 102/66   Pulse 83   Temp 98.8 F (37.1 C)   Ht 5\' 5"  (1.651 m)   Wt 148 lb 6 oz (67.3 kg)   SpO2 100%   BMI 24.69 kg/m  BP Readings from Last 3 Encounters:  10/22/23 102/66  10/08/23 122/88  10/04/23 (!) 151/77      Physical Exam Vitals and nursing note reviewed.  Constitutional:      General: He is not in acute distress.    Appearance: Normal appearance. He is normal weight.  Cardiovascular:     Rate and Rhythm: Normal rate and regular rhythm.     Heart sounds: Normal heart sounds.  Neurological:     Mental Status: He is alert.      No results found for any visits on 10/22/23.    The ASCVD Risk score (Arnett DK, et al., 2019) failed to calculate for the following reasons:   The 2019 ASCVD risk score is only valid for ages 65 to 60    Assessment & Plan:   Problem List Items Addressed This Visit     Encounter for administration of vaccine   Relevant Orders   PFIZER Comirnaty(GRAY TOP)COVID-19 Vaccine   Elevated blood pressure reading in office without diagnosis of hypertension - Primary   Blood pressure is well controlled. Not  currently in need of medication.  Recommend random blood pressure checks at home. No further follow-up required, unless blood pressure changes. Continue exercise.      Agrees with plan of care discussed.  Questions answered.   Return if symptoms worsen or fail to improve.    Novella Olive, FNP

## 2023-10-22 NOTE — Assessment & Plan Note (Addendum)
 Blood pressure is well controlled. Not currently in need of medication.  Recommend random blood pressure checks at home. No further follow-up required, unless blood pressure changes. Continue exercise.

## 2024-02-25 ENCOUNTER — Encounter: Payer: Self-pay | Admitting: Family Medicine

## 2024-02-25 ENCOUNTER — Ambulatory Visit (INDEPENDENT_AMBULATORY_CARE_PROVIDER_SITE_OTHER): Admitting: Family Medicine

## 2024-02-25 VITALS — BP 117/76 | HR 91 | Ht 65.0 in | Wt 148.0 lb

## 2024-02-25 DIAGNOSIS — H00011 Hordeolum externum right upper eyelid: Secondary | ICD-10-CM | POA: Diagnosis not present

## 2024-02-25 MED ORDER — ERYTHROMYCIN 5 MG/GM OP OINT: 1.0000 | TOPICAL_OINTMENT | Freq: Every day | OPHTHALMIC | 0 refills | Status: DC

## 2024-02-25 NOTE — Assessment & Plan Note (Signed)
 Hordeolum present on right upper lid for one month. Has improved with warm compresses, not resolved. No vision changes. No eye pain. No exudate. Gentle cleansing with baby shampoo before applying antibiotic ointment.  Apply erythromycin opthalmic ointment nightly. Follow-up if symptoms do not resolve.

## 2024-02-25 NOTE — Progress Notes (Signed)
   Acute Office Visit  Subjective:     Patient ID: Raymond Ramesh., male    DOB: [birth year], 28 y.o.   MRN: [id number]  Chief Complaint  Patient presents with   Stye    Patient has had a stye on right eye for a little over a month that will not go away. States he tried warm compresses which did take the swelling and tenderness away but the stye is still there.    HPI Patient is in today for stye on right eye. Has tried warm compresses which have improved symptoms.  No vision changes. No drainage.  No eye pain.   Review of Systems  Eyes:  Negative for blurred vision, double vision, photophobia, discharge and redness.        Objective:    BP 117/76 (BP Location: Left Arm, Patient Position: Sitting, Cuff Size: Normal)   Pulse 91   Ht 5' 5 (1.651 m)   Wt 148 lb (67.1 kg)   SpO2 100%   BMI 24.63 kg/m    Physical Exam Vitals and nursing note reviewed.  Constitutional:      General: He is not in acute distress.    Appearance: Normal appearance. He is normal weight.   Eyes:     General: Vision grossly intact.        Right eye: Hordeolum present.     Extraocular Movements: Extraocular movements intact.     Conjunctiva/sclera: Conjunctivae normal.     Cardiovascular:     Rate and Rhythm: Normal rate.  Pulmonary:     Effort: Pulmonary effort is normal.   Skin:    General: Skin is warm and dry.   Neurological:     Mental Status: He is alert. Mental status is at baseline.   Psychiatric:        Mood and Affect: Mood normal.        Behavior: Behavior normal.        Thought Content: Thought content normal.        Judgment: Judgment normal.    No results found for any visits on 02/25/24.      Assessment & Plan:   Problem List Items Addressed This Visit     Hordeolum externum of right upper eyelid - Primary   Hordeolum present on right upper lid for one month. Has improved with warm compresses, not resolved. No vision changes. No eye pain. No  exudate. Gentle cleansing with baby shampoo before applying antibiotic ointment.  Apply erythromycin opthalmic ointment nightly. Follow-up if symptoms do not resolve.       Relevant Medications   erythromycin ophthalmic ointment    Meds ordered this encounter  Medications   erythromycin ophthalmic ointment    Sig: Place 1 Application into the right eye at bedtime.    Dispense:  3.5 g    Refill:  0    Supervising Provider:   COLETTE TORRENCE GRADE [[id number]]  Agrees with plan of care discussed.  Questions answered.   Return if symptoms worsen or fail to improve.  Darice JONELLE Brownie, FNP

## 2024-04-14 ENCOUNTER — Ambulatory Visit: Admitting: Family Medicine

## 2024-04-14 ENCOUNTER — Encounter: Payer: Self-pay | Admitting: Family Medicine

## 2024-04-14 VITALS — BP 107/67 | HR 68 | Temp 98.7°F | Ht 65.0 in | Wt 151.0 lb

## 2024-04-14 DIAGNOSIS — L0211 Cutaneous abscess of neck: Secondary | ICD-10-CM | POA: Insufficient documentation

## 2024-04-14 MED ORDER — CEPHALEXIN 500 MG PO CAPS: 500.0000 mg | ORAL_CAPSULE | Freq: Two times a day (BID) | ORAL | 0 refills | Status: AC

## 2024-04-14 NOTE — Progress Notes (Signed)
   Acute Office Visit  Subjective:     Patient ID: Raymond Hammond., male    DOB: August 22, 1996, 28 y.o.   MRN: [id number]  Chief Complaint  Patient presents with   Bump on Neck    HPI Patient is in today for large abscess left side of posterior neck. Present for one week. Spontaneously ruptured last night with blood and pus.  Painful to touch.  Much smaller after draining. Viewed picture of abscess prior to draining.   ROS      Objective:    BP 107/67 (BP Location: Left Arm, Patient Position: Sitting)   Pulse 68   Temp 98.7 F (37.1 C) (Oral)   Ht 5' 5 (1.651 m)   Wt 151 lb (68.5 kg)   SpO2 100%   BMI 25.13 kg/m    Physical Exam Skin:    Comments: 3 cm x 3 cm firn area on left posterior neck.      No results found for any visits on 04/14/24.      Assessment & Plan:   Problem List Items Addressed This Visit     Cutaneous abscess of neck - Primary   Before draining abscess last night, area was large and erythematous per picture on phone.  Painful to touch. Present for one week. Today area is 3 cm x 3 cm firm with very little pus. Keflex  500 mg BID for 10 days. Warm compresses as tolerated to facilitate more drainage. Keep area clean with soap and water. Follow-up if symptoms do not resolve.       Relevant Medications   cephALEXin  (KEFLEX ) 500 MG capsule    Meds ordered this encounter  Medications   cephALEXin  (KEFLEX ) 500 MG capsule    Sig: Take 1 capsule (500 mg total) by mouth 2 (two) times daily.    Dispense:  20 capsule    Refill:  0    Supervising Provider:   METHENEY, CATHERINE D [[id number]]  Agrees with plan of care discussed.  Questions answered.   Return if symptoms worsen or fail to improve.  Raymond JONELLE Brownie, FNP

## 2024-04-14 NOTE — Assessment & Plan Note (Addendum)
 Before draining abscess last night, area was large and erythematous per picture on phone.  Painful to touch. Present for one week. Today area is 3 cm x 3 cm firm with very little pus. Keflex  500 mg BID for 10 days. Warm compresses as tolerated to facilitate more drainage. Keep area clean with soap and water. Follow-up if symptoms do not resolve.

## 2024-04-14 NOTE — Patient Instructions (Signed)
 You were prescribed an antibiotic today. Please complete the full course.  Acetaminophen or ibuprofen  for fever according to package instructions, do not exceed recommended doses.  Adequate fluids to avoid dehydration. Lots of rest while you are recovering.  Follow-up if your symptoms do not improve.

## 2024-09-07 ENCOUNTER — Other Ambulatory Visit: Payer: Self-pay | Admitting: Family Medicine

## 2024-09-08 ENCOUNTER — Telehealth: Payer: Self-pay

## 2024-09-08 DIAGNOSIS — F32A Depression, unspecified: Secondary | ICD-10-CM

## 2024-09-08 MED ORDER — FLUOXETINE HCL 20 MG PO CAPS: 20.0000 mg | ORAL_CAPSULE | Freq: Every day | ORAL | 3 refills | Status: AC

## 2024-09-08 NOTE — Telephone Encounter (Signed)
 Patient is requesting that before you leave if you could please send in his Rx refill:   FLUoxetine  (PROZAC ) 20 MG capsule   Pharmacy is correct in Epic.

## 2024-10-09 ENCOUNTER — Encounter: Admitting: Family Medicine

## 2024-10-15 ENCOUNTER — Encounter: Admitting: Family Medicine
